# Patient Record
Sex: Male | Born: 1985 | Race: White | Hispanic: No | Marital: Single | Smoking: Former smoker
Health system: Southern US, Community
[De-identification: ages and names within clinical notes are randomized; demographics above are authoritative.]

## PROBLEM LIST (undated history)

## (undated) DIAGNOSIS — Z789 Other specified health status: Secondary | ICD-10-CM

## (undated) HISTORY — PX: WISDOM TOOTH EXTRACTION: SHX21

## (undated) HISTORY — PX: WRIST SURGERY: SHX841

---

## 2015-08-23 ENCOUNTER — Encounter: Payer: Self-pay | Admitting: Internal Medicine

## 2015-10-20 ENCOUNTER — Ambulatory Visit: Payer: Self-pay | Admitting: Internal Medicine

## 2015-11-15 MED FILL — ZUBSOLV 5.7-1.4 MG TAB SL: 5.7-1.4 | 2 days supply | Qty: 4 | Fill #0

## 2017-12-05 ENCOUNTER — Encounter (HOSPITAL_COMMUNITY)
Admission: EM | Disposition: A | Discharge status: Home / Self Care | Payer: Self-pay | Source: Home / Self Care | Attending: Orthopaedic Surgery

## 2017-12-05 ENCOUNTER — Emergency Department (HOSPITAL_COMMUNITY): Payer: Self-pay

## 2017-12-05 ENCOUNTER — Inpatient Hospital Stay (HOSPITAL_COMMUNITY)
Admission: EM | Admit: 2017-12-05 | Discharge: 2017-12-07 | DRG: 493 | Disposition: A | Discharge status: Home / Self Care | Payer: Self-pay | Attending: Orthopaedic Surgery | Admitting: Orthopaedic Surgery

## 2017-12-05 ENCOUNTER — Encounter (HOSPITAL_COMMUNITY): Payer: Self-pay | Admitting: Emergency Medicine

## 2017-12-05 ENCOUNTER — Emergency Department (HOSPITAL_COMMUNITY): Payer: Self-pay | Admitting: Certified Registered Nurse Anesthetist

## 2017-12-05 DIAGNOSIS — R339 Retention of urine, unspecified: Secondary | ICD-10-CM | POA: Diagnosis not present

## 2017-12-05 DIAGNOSIS — Y9351 Activity, roller skating (inline) and skateboarding: Secondary | ICD-10-CM

## 2017-12-05 DIAGNOSIS — W010XXA Fall on same level from slipping, tripping and stumbling without subsequent striking against object, initial encounter: Secondary | ICD-10-CM | POA: Diagnosis present

## 2017-12-05 DIAGNOSIS — T79A22A Traumatic compartment syndrome of left lower extremity, initial encounter: Secondary | ICD-10-CM | POA: Diagnosis present

## 2017-12-05 DIAGNOSIS — S82202A Unspecified fracture of shaft of left tibia, initial encounter for closed fracture: Principal | ICD-10-CM | POA: Diagnosis present

## 2017-12-05 DIAGNOSIS — Z23 Encounter for immunization: Secondary | ICD-10-CM

## 2017-12-05 DIAGNOSIS — Z419 Encounter for procedure for purposes other than remedying health state, unspecified: Secondary | ICD-10-CM

## 2017-12-05 DIAGNOSIS — S82402A Unspecified fracture of shaft of left fibula, initial encounter for closed fracture: Secondary | ICD-10-CM

## 2017-12-05 HISTORY — PX: TIBIA IM NAIL INSERTION: SHX2516

## 2017-12-05 HISTORY — DX: Other specified health status: Z78.9

## 2017-12-05 LAB — BASIC METABOLIC PANEL
ANION GAP: 11 (ref 5–15)
BUN: 14 mg/dL (ref 6–20)
CALCIUM: 9.7 mg/dL (ref 8.9–10.3)
CO2: 23 mmol/L (ref 22–32)
Chloride: 103 mmol/L (ref 101–111)
Creatinine, Ser: 0.9 mg/dL (ref 0.61–1.24)
Glucose, Bld: 95 mg/dL (ref 65–99)
Potassium: 3.8 mmol/L (ref 3.5–5.1)
SODIUM: 137 mmol/L (ref 135–145)

## 2017-12-05 LAB — CBC WITH DIFFERENTIAL/PLATELET
BASOS ABS: 0.1 10*3/uL (ref 0.0–0.1)
BASOS PCT: 0 %
Eosinophils Absolute: 0 10*3/uL (ref 0.0–0.7)
Eosinophils Relative: 0 %
HCT: 43.9 % (ref 39.0–52.0)
HEMOGLOBIN: 16.2 g/dL (ref 13.0–17.0)
Lymphocytes Relative: 12 %
Lymphs Abs: 1.4 10*3/uL (ref 0.7–4.0)
MCH: 31 pg (ref 26.0–34.0)
MCHC: 36.9 g/dL — ABNORMAL HIGH (ref 30.0–36.0)
MCV: 83.9 fL (ref 78.0–100.0)
MONO ABS: 0.8 10*3/uL (ref 0.1–1.0)
Monocytes Relative: 7 %
NEUTROS ABS: 9.7 10*3/uL — AB (ref 1.7–7.7)
NEUTROS PCT: 81 %
Platelets: 220 10*3/uL (ref 150–400)
RBC: 5.23 MIL/uL (ref 4.22–5.81)
RDW: 12.6 % (ref 11.5–15.5)
WBC: 12 10*3/uL — ABNORMAL HIGH (ref 4.0–10.5)

## 2017-12-05 SURGERY — INSERTION, INTRAMEDULLARY ROD, TIBIA
Anesthesia: General | Site: Leg Lower | Laterality: Left

## 2017-12-05 MED ORDER — METOCLOPRAMIDE HCL 5 MG/ML IJ SOLN: 5.0000 mg | Freq: Three times a day (TID) | INTRAMUSCULAR | Status: DC | PRN

## 2017-12-05 MED ORDER — ROCURONIUM BROMIDE 10 MG/ML (PF) SYRINGE
PREFILLED_SYRINGE | INTRAVENOUS | Status: DC | PRN
  Administered 2017-12-05: 40 mg via INTRAVENOUS
  Administered 2017-12-05 (×4): 10 mg via INTRAVENOUS

## 2017-12-05 MED ORDER — FENTANYL CITRATE (PF) 250 MCG/5ML IJ SOLN
INTRAMUSCULAR | Status: AC
  Filled 2017-12-05: qty 5

## 2017-12-05 MED ORDER — CEFAZOLIN SODIUM-DEXTROSE 1-4 GM/50ML-% IV SOLN
1.0000 g | Freq: Four times a day (QID) | INTRAVENOUS | Status: AC
  Administered 2017-12-06 (×3): 1 g via INTRAVENOUS
  Filled 2017-12-05 (×3): qty 50

## 2017-12-05 MED ORDER — METOCLOPRAMIDE HCL 5 MG PO TABS: 5.0000 mg | ORAL_TABLET | Freq: Three times a day (TID) | ORAL | Status: DC | PRN

## 2017-12-05 MED ORDER — ONDANSETRON HCL 4 MG/2ML IJ SOLN: 4.0000 mg | Freq: Once | INTRAMUSCULAR | Status: DC | PRN

## 2017-12-05 MED ORDER — ACETAMINOPHEN 325 MG PO TABS: 650.0000 mg | ORAL_TABLET | ORAL | Status: DC | PRN

## 2017-12-05 MED ORDER — METHOCARBAMOL 500 MG PO TABS
500.0000 mg | ORAL_TABLET | Freq: Four times a day (QID) | ORAL | Status: DC | PRN
  Administered 2017-12-06 – 2017-12-07 (×4): 500 mg via ORAL
  Filled 2017-12-05 (×4): qty 1

## 2017-12-05 MED ORDER — DEXTROSE 5 % IV SOLN
500.0000 mg | Freq: Four times a day (QID) | INTRAVENOUS | Status: DC | PRN
  Administered 2017-12-05: 500 mg via INTRAVENOUS
  Filled 2017-12-05: qty 550

## 2017-12-05 MED ORDER — ONDANSETRON HCL 4 MG PO TABS: 4.0000 mg | ORAL_TABLET | Freq: Four times a day (QID) | ORAL | Status: DC | PRN

## 2017-12-05 MED ORDER — MIDAZOLAM HCL 2 MG/2ML IJ SOLN
INTRAMUSCULAR | Status: AC
  Filled 2017-12-05: qty 2

## 2017-12-05 MED ORDER — HYDROCODONE-ACETAMINOPHEN 5-325 MG PO TABS
1.0000 | ORAL_TABLET | ORAL | Status: DC | PRN
  Administered 2017-12-06 – 2017-12-07 (×4): 2 via ORAL
  Filled 2017-12-05 (×4): qty 2

## 2017-12-05 MED ORDER — SUCCINYLCHOLINE CHLORIDE 200 MG/10ML IV SOSY
PREFILLED_SYRINGE | INTRAVENOUS | Status: DC | PRN
  Administered 2017-12-05: 120 mg via INTRAVENOUS

## 2017-12-05 MED ORDER — HYDROMORPHONE HCL 1 MG/ML IJ SOLN
1.0000 mg | Freq: Once | INTRAMUSCULAR | Status: AC
Start: 2017-12-05 — End: 2017-12-05
  Administered 2017-12-05: 1 mg via INTRAVENOUS
  Filled 2017-12-05: qty 1

## 2017-12-05 MED ORDER — LACTATED RINGERS IV SOLN
INTRAVENOUS | Status: DC
  Administered 2017-12-05 (×2): via INTRAVENOUS

## 2017-12-05 MED ORDER — SUGAMMADEX SODIUM 200 MG/2ML IV SOLN
INTRAVENOUS | Status: DC | PRN
  Administered 2017-12-05: 200 mg via INTRAVENOUS

## 2017-12-05 MED ORDER — LIDOCAINE 2% (20 MG/ML) 5 ML SYRINGE
INTRAMUSCULAR | Status: DC | PRN
  Administered 2017-12-05: 100 mg via INTRAVENOUS

## 2017-12-05 MED ORDER — MIDAZOLAM HCL 5 MG/5ML IJ SOLN
INTRAMUSCULAR | Status: DC | PRN
  Administered 2017-12-05: 2 mg via INTRAVENOUS

## 2017-12-05 MED ORDER — DEXMEDETOMIDINE HCL 200 MCG/2ML IV SOLN
INTRAVENOUS | Status: DC | PRN
  Administered 2017-12-05: 8 ug via INTRAVENOUS
  Administered 2017-12-05: 12 ug via INTRAVENOUS

## 2017-12-05 MED ORDER — 0.9 % SODIUM CHLORIDE (POUR BTL) OPTIME
TOPICAL | Status: DC | PRN
  Administered 2017-12-05: 1000 mL

## 2017-12-05 MED ORDER — PHENYLEPHRINE 40 MCG/ML (10ML) SYRINGE FOR IV PUSH (FOR BLOOD PRESSURE SUPPORT)
PREFILLED_SYRINGE | INTRAVENOUS | Status: DC | PRN
  Administered 2017-12-05 (×3): 80 ug via INTRAVENOUS

## 2017-12-05 MED ORDER — OXYCODONE HCL 5 MG PO TABS
10.0000 mg | ORAL_TABLET | ORAL | Status: DC | PRN
  Administered 2017-12-05 – 2017-12-06 (×4): 10 mg via ORAL
  Filled 2017-12-05 (×4): qty 2

## 2017-12-05 MED ORDER — CEFAZOLIN SODIUM-DEXTROSE 2-3 GM-%(50ML) IV SOLR
INTRAVENOUS | Status: DC | PRN
  Administered 2017-12-05: 2 g via INTRAVENOUS

## 2017-12-05 MED ORDER — SODIUM CHLORIDE 0.9 % IV SOLN
INTRAVENOUS | Status: DC
  Administered 2017-12-06 (×2): via INTRAVENOUS

## 2017-12-05 MED ORDER — FENTANYL CITRATE (PF) 100 MCG/2ML IJ SOLN
INTRAMUSCULAR | Status: DC | PRN
  Administered 2017-12-05: 50 ug via INTRAVENOUS
  Administered 2017-12-05: 200 ug via INTRAVENOUS
  Administered 2017-12-05 (×4): 50 ug via INTRAVENOUS

## 2017-12-05 MED ORDER — HYDROMORPHONE HCL 1 MG/ML IJ SOLN
INTRAMUSCULAR | Status: AC
  Filled 2017-12-05: qty 1

## 2017-12-05 MED ORDER — ONDANSETRON HCL 4 MG/2ML IJ SOLN: 4.0000 mg | Freq: Four times a day (QID) | INTRAMUSCULAR | Status: DC | PRN

## 2017-12-05 MED ORDER — DIPHENHYDRAMINE HCL 12.5 MG/5ML PO ELIX: 12.5000 mg | ORAL_SOLUTION | ORAL | Status: DC | PRN

## 2017-12-05 MED ORDER — ACETAMINOPHEN 650 MG RE SUPP: 650.0000 mg | RECTAL | Status: DC | PRN

## 2017-12-05 MED ORDER — DEXMEDETOMIDINE HCL IN NACL 200 MCG/50ML IV SOLN
INTRAVENOUS | Status: AC
  Filled 2017-12-05: qty 50

## 2017-12-05 MED ORDER — HYDROMORPHONE HCL 1 MG/ML IJ SOLN
1.0000 mg | INTRAMUSCULAR | Status: DC | PRN
  Administered 2017-12-06 (×2): 1 mg via INTRAVENOUS
  Filled 2017-12-05 (×2): qty 1

## 2017-12-05 MED ORDER — HYDROMORPHONE HCL 1 MG/ML IJ SOLN
0.2500 mg | INTRAMUSCULAR | Status: DC | PRN
  Administered 2017-12-05 (×4): 0.5 mg via INTRAVENOUS

## 2017-12-05 MED ORDER — ONDANSETRON HCL 4 MG/2ML IJ SOLN
INTRAMUSCULAR | Status: DC | PRN
  Administered 2017-12-05: 4 mg via INTRAVENOUS

## 2017-12-05 MED ORDER — MEPERIDINE HCL 50 MG/ML IJ SOLN: 6.2500 mg | INTRAMUSCULAR | Status: DC | PRN

## 2017-12-05 MED ORDER — CEFAZOLIN SODIUM-DEXTROSE 2-4 GM/100ML-% IV SOLN
INTRAVENOUS | Status: AC
  Filled 2017-12-05: qty 100

## 2017-12-05 MED ORDER — PROPOFOL 10 MG/ML IV BOLUS
INTRAVENOUS | Status: AC
  Filled 2017-12-05: qty 20

## 2017-12-05 MED ORDER — PROPOFOL 10 MG/ML IV BOLUS
INTRAVENOUS | Status: DC | PRN
  Administered 2017-12-05: 150 mg via INTRAVENOUS

## 2017-12-05 SURGICAL SUPPLY — 47 items
BANDAGE ACE 4X5 VEL STRL LF (GAUZE/BANDAGES/DRESSINGS) ×3 IMPLANT
BANDAGE ACE 6X5 VEL STRL LF (GAUZE/BANDAGES/DRESSINGS) ×3 IMPLANT
BIT DRILL AO GAMMA 4.2X130 (BIT) ×3 IMPLANT
BIT DRILL AO GAMMA 4.2X180 (BIT) ×3 IMPLANT
BIT DRILL AO GAMMA 4.2X340 (BIT) ×3 IMPLANT
CLOTH BEACON ORANGE TIMEOUT ST (SAFETY) ×3 IMPLANT
COVER SURGICAL LIGHT HANDLE (MISCELLANEOUS) ×3 IMPLANT
DRAPE C-ARM 42X120 X-RAY (DRAPES) ×3 IMPLANT
DRAPE C-ARMOR (DRAPES) ×3 IMPLANT
DRAPE U-SHAPE 47X51 STRL (DRAPES) ×3 IMPLANT
DURAPREP 26ML APPLICATOR (WOUND CARE) ×3 IMPLANT
ELECT REM PT RETURN 15FT ADLT (MISCELLANEOUS) ×3 IMPLANT
GAUZE SPONGE 4X4 12PLY STRL (GAUZE/BANDAGES/DRESSINGS) ×3 IMPLANT
GAUZE XEROFORM 5X9 LF (GAUZE/BANDAGES/DRESSINGS) ×3 IMPLANT
GLOVE BIO SURGEON STRL SZ7.5 (GLOVE) ×6 IMPLANT
GLOVE BIOGEL PI IND STRL 8 (GLOVE) ×2 IMPLANT
GLOVE BIOGEL PI INDICATOR 8 (GLOVE) ×4
GLOVE ECLIPSE 8.0 STRL XLNG CF (GLOVE) ×3 IMPLANT
GOWN STRL REUS W/TWL LRG LVL3 (GOWN DISPOSABLE) ×3 IMPLANT
GOWN STRL REUS W/TWL XL LVL3 (GOWN DISPOSABLE) ×6 IMPLANT
GUIDEROD T2 3X1000 (ROD) ×3 IMPLANT
GUIDEWIRE GAMMA (WIRE) ×6 IMPLANT
K-WIRE FIXATION 3X285 COATED (WIRE) ×6
KWIRE FIXATION 3X285 COATED (WIRE) ×2 IMPLANT
MANIFOLD NEPTUNE II (INSTRUMENTS) ×3 IMPLANT
NAIL ELAS INSERT SLV SPI 8-11 (MISCELLANEOUS) ×3 IMPLANT
NAIL IM TIBIAL T2 10X34.5 (Nail) ×1 IMPLANT
NAIL TIBIAL 10X34.5 (Nail) ×3 IMPLANT
PACK TOTAL KNEE CUSTOM (KITS) ×3 IMPLANT
PAD ABD 8X10 STRL (GAUZE/BANDAGES/DRESSINGS) ×9 IMPLANT
PAD CAST 4YDX4 CTTN HI CHSV (CAST SUPPLIES) ×1 IMPLANT
PADDING CAST COTTON 4X4 STRL (CAST SUPPLIES) ×2
PADDING CAST COTTON 6X4 STRL (CAST SUPPLIES) ×3 IMPLANT
POSITIONER SURGICAL ARM (MISCELLANEOUS) ×3 IMPLANT
REAMER INTRAMEDULLARY 8MM 510 (MISCELLANEOUS) ×3 IMPLANT
SCREW LOCKING FULL THREAD 5X52 (Screw) ×3 IMPLANT
SCREW LOCKING T2 F/T  5MMX40MM (Screw) ×2 IMPLANT
SCREW LOCKING T2 F/T  5MMX45MM (Screw) ×2 IMPLANT
SCREW LOCKING T2 F/T 5MMX40MM (Screw) ×1 IMPLANT
SCREW LOCKING T2 F/T 5MMX45MM (Screw) ×1 IMPLANT
SUT ETHILON 2 0 PSLX (SUTURE) ×6 IMPLANT
SUT VIC AB 0 CT1 27 (SUTURE) ×4
SUT VIC AB 0 CT1 27XBRD ANTBC (SUTURE) ×2 IMPLANT
SUT VIC AB 1 CT1 36 (SUTURE) ×3 IMPLANT
SUT VIC AB 2-0 CT1 27 (SUTURE) ×4
SUT VIC AB 2-0 CT1 TAPERPNT 27 (SUTURE) ×2 IMPLANT
WATER STERILE IRR 1000ML POUR (IV SOLUTION) ×3 IMPLANT

## 2017-12-05 NOTE — Brief Op Note (Signed)
12/05/2017  9:17 PM  PATIENT:  Carl Ramirez  32 y.o. male  PRE-OPERATIVE DIAGNOSIS:  left tibia/fibula fracture  POST-OPERATIVE DIAGNOSIS:  left tibia/fibula fracture  PROCEDURE:  Procedure(s): INTRAMEDULLARY (IM) NAIL TIBIAL, FOUR COMPARTMENT FASCIOTOMIES LEFT LEG (Left)  SURGEON:  Surgeon(s) and Role:    Kathryne Hitch* Ezequias Lard Y, MD - Primary  PHYSICIAN ASSISTANT: Rexene EdisonGil Clark, PA-C  ANESTHESIA:   general  EBL:  150 mL   COUNTS:  YES  DICTATION: .Other Dictation: Dictation Number [id number]  PLAN OF CARE: Admit to inpatient   PATIENT DISPOSITION:  PACU - hemodynamically stable.   Delay start of Pharmacological VTE agent (>24hrs) due to surgical blood loss or risk of bleeding: no

## 2017-12-05 NOTE — ED Provider Notes (Signed)
Medical screening examination/treatment/procedure(s) were conducted as a shared visit with non-physician practitioner(s) and myself.  I personally evaluated the patient during the encounter.   EKG Interpretation None       31yM with mid L tib/fib fxs. Fell while skateboarding. Closed injuries. Compartments soft. Can wiggle toes. Sensation intact to light touch. Palpable DP pulse. Toenails painted red. Scattered poorly done tattoos. Will need ORIF. Will discuss with ortho. Reduction/splinting deferred unless going to be discharged. Also possible rib fxs. Will be getting pain meds for leg anyways. He offered history of opiate abuse. Their usage is certainly appropriate with these injuries but will let him use his best judgement if he wants to use them acutely. Incentive spirometry.   Dg Ribs Unilateral W/chest Left  Result Date: 12/05/2017 CLINICAL DATA:  Recent fall with left-sided rib pain, initial encounter EXAM: LEFT RIBS AND CHEST - 3+ VIEW COMPARISON:  None. FINDINGS: Cardiac shadow is within normal limits. The lungs are well aerated bilaterally. No pneumothorax is seen. Mild irregularity is noted along the superior margin of the left sixth and seventh ribs posteriorly. It would be difficult to exclude an undisplaced rib fracture. No underlying complicating factors are seen. No other rib fractures are noted. IMPRESSION: Mild irregularity in the left sixth and seventh ribs posteriorly. These may represent undisplaced fractures. Electronically Signed   By: Alcide CleverMark  Lukens M.D.   On: 12/05/2017 17:03   Dg Tibia/fibula Left  Result Date: 12/05/2017 CLINICAL DATA:  Left lower leg pain after losing control of the skateboard going down heel. EXAM: LEFT TIBIA AND FIBULA - 2 VIEW COMPARISON:  None in PACs FINDINGS: The patient has sustained complete fractures of the mid shafts of the left tibia and fibula. There angulation at both fracture sites apex anterior. There is comminution of the fracture fragments.  There is displacement as well. IMPRESSION: There are acute comminuted angulated and displaced fractures of the midshaft of the left tibia and fibula. No definite soft tissue disruption is observed. The observed portions of the knee and ankle exhibit no acute abnormalities. Electronically Signed   By: David  SwazilandJordan M.D.   On: 12/05/2017 15:48     Raeford RazorKohut, Tabitha Tupper, MD 12/05/17 1712

## 2017-12-05 NOTE — ED Notes (Signed)
Bed: [facility] Expected date:  Expected time:  Means of arrival:  Comments: 

## 2017-12-05 NOTE — Anesthesia Preprocedure Evaluation (Signed)
Anesthesia Evaluation  Patient identified by MRN, date of birth, ID band Patient awake    Reviewed: Allergy & Precautions, NPO status , Patient's Chart, lab work & pertinent test results  Airway Mallampati: I  TM Distance: >3 FB Neck ROM: Full    Dental   Pulmonary    Pulmonary exam normal        Cardiovascular Normal cardiovascular exam     Neuro/Psych    GI/Hepatic   Endo/Other    Renal/GU      Musculoskeletal   Abdominal   Peds  Hematology   Anesthesia Other Findings   Reproductive/Obstetrics                             Anesthesia Physical Anesthesia Plan  ASA: II  Anesthesia Plan: General   Post-op Pain Management:    Induction: Intravenous, Rapid sequence and Cricoid pressure planned  PONV Risk Score and Plan: 2 and Ondansetron, Midazolam and Treatment may vary due to age or medical condition  Airway Management Planned: Oral ETT  Additional Equipment:   Intra-op Plan:   Post-operative Plan: Extubation in OR  Informed Consent: I have reviewed the patients History and Physical, chart, labs and discussed the procedure including the risks, benefits and alternatives for the proposed anesthesia with the patient or authorized representative who has indicated his/her understanding and acceptance.     Plan Discussed with: CRNA and Surgeon  Anesthesia Plan Comments:         Anesthesia Quick Evaluation

## 2017-12-05 NOTE — ED Provider Notes (Signed)
[facility] Provider Note   CSN: [id number] Arrival date & time: 12/05/17  1503     History   Chief Complaint Chief Complaint  Patient presents with  . Leg Pain    HPI Carl Ramirez is a 32 y.o. male with no significant past medical history, who presents to ED for evaluation of left leg pain after accident that occurred prior to arrival.  He was skateboarding down a hill at approximately 15 mph when he fell onto his left leg.  He does not remember which direction his leg twisted or turned.  He denies any head injury or loss of consciousness.  States that he noticed his leg "just dangling off" but no exposed bone.  He denies any loss of sensation.  Denies any previous fracture, dislocations or procedures in the area.  His only pertinent for though history includes right wrist fracture several years ago.  He denies any back pain, neck pain, trouble breathing, trouble swallowing, or vomiting.  HPI  History reviewed. No pertinent past medical history.  Patient Active Problem List   Diagnosis Date Noted  . Closed fracture of left fibula and tibia     Past Surgical History:  Procedure Laterality Date  . WISDOM TOOTH EXTRACTION    . WRIST SURGERY Right    35yrs ago       Home Medications    Prior to Admission medications   Not on File    Family History History reviewed. No pertinent family history.  Social History Social History   Tobacco Use  . Smoking status: Former Smoker    Types: Cigarettes    Last attempt to quit: 12/05/2012    Years since quitting: 5.0  Substance Use Topics  . Alcohol use: No    Frequency: Never  . Drug use: No     Allergies   Patient has no known allergies.   Review of Systems Review of Systems  Constitutional: Negative for appetite change, chills and fever.  HENT: Negative for ear pain, rhinorrhea, sneezing and sore throat.   Eyes: Negative for photophobia and visual disturbance.  Respiratory: Negative for  cough, chest tightness, shortness of breath and wheezing.   Cardiovascular: Negative for chest pain and palpitations.  Gastrointestinal: Negative for abdominal pain, blood in stool, constipation, diarrhea, nausea and vomiting.  Genitourinary: Negative for dysuria, hematuria and urgency.  Musculoskeletal: Positive for myalgias.  Skin: Negative for rash.  Neurological: Negative for dizziness, weakness and light-headedness.     Physical Exam Updated Vital Signs BP 107/71   Pulse (!) 108   Temp 98.3 F (36.8 C)   Resp 20   SpO2 99%   Physical Exam  Constitutional: He appears well-developed and well-nourished. No distress.  HENT:  Head: Normocephalic and atraumatic.  Nose: Nose normal.  Eyes: Conjunctivae and EOM are normal. Right eye exhibits no discharge. Left eye exhibits no discharge. No scleral icterus.  Neck: Normal range of motion. Neck supple.  Cardiovascular: Normal rate, regular rhythm, normal heart sounds and intact distal pulses. Exam reveals no gallop and no friction rub.  No murmur heard. Pulmonary/Chest: Effort normal and breath sounds normal. No respiratory distress.      Abdominal: Soft. Bowel sounds are normal. He exhibits no distension. There is no tenderness. There is no guarding.  Musculoskeletal: He exhibits edema, tenderness and deformity.  There is obvious edema of the area of the left tibia and fibula indicated in the image.  2+ DP pulse noted.  Able to perform full  active and passive range of motion of ankle and digits.  Sensation intact to light touch of left lower extremity.  No open wounds or fractures noted.  Neurological: He is alert. He exhibits normal muscle tone. Coordination normal.  Skin: Skin is warm and dry. No rash noted.  Psychiatric: He has a normal mood and affect.  Nursing note and vitals reviewed.      ED Treatments / Results  Labs (all labs ordered are listed, but only abnormal results are displayed) Labs Reviewed  CBC WITH  DIFFERENTIAL/PLATELET - Abnormal; Notable for the following components:      Result Value   WBC 12.0 (*)    MCHC 36.9 (*)    Neutro Abs 9.7 (*)    All other components within normal limits  BASIC METABOLIC PANEL    EKG  EKG Interpretation None       Radiology Dg Ribs Unilateral W/chest Left  Result Date: 12/05/2017 CLINICAL DATA:  Recent fall with left-sided rib pain, initial encounter EXAM: LEFT RIBS AND CHEST - 3+ VIEW COMPARISON:  None. FINDINGS: Cardiac shadow is within normal limits. The lungs are well aerated bilaterally. No pneumothorax is seen. Mild irregularity is noted along the superior margin of the left sixth and seventh ribs posteriorly. It would be difficult to exclude an undisplaced rib fracture. No underlying complicating factors are seen. No other rib fractures are noted. IMPRESSION: Mild irregularity in the left sixth and seventh ribs posteriorly. These may represent undisplaced fractures. Electronically Signed   By: Alcide CleverMark  Lukens M.D.   On: 12/05/2017 17:03   Dg Tibia/fibula Left  Result Date: 12/05/2017 CLINICAL DATA:  Left lower leg pain after losing control of the skateboard going down heel. EXAM: LEFT TIBIA AND FIBULA - 2 VIEW COMPARISON:  None in PACs FINDINGS: The patient has sustained complete fractures of the mid shafts of the left tibia and fibula. There angulation at both fracture sites apex anterior. There is comminution of the fracture fragments. There is displacement as well. IMPRESSION: There are acute comminuted angulated and displaced fractures of the midshaft of the left tibia and fibula. No definite soft tissue disruption is observed. The observed portions of the knee and ankle exhibit no acute abnormalities. Electronically Signed   By: David  [place] M.D.   On: 12/05/2017 15:48    Procedures Procedures (including critical care time)  Medications Ordered in ED Medications  lactated ringers infusion ( Intravenous New Bag/Given 12/05/17 1906)    ceFAZolin (ANCEF) 2-4 GM/100ML-% IVPB (not administered)  HYDROmorphone (DILAUDID) injection 1 mg (1 mg Intravenous Given 12/05/17 1558)     Initial Impression / Assessment and Plan / ED Course  I have reviewed the triage vital signs and the nursing notes.  Pertinent labs & imaging results that were available during my care of the patient were reviewed by me and considered in my medical decision making (see chart for details).     Patient presents to ED for evaluation of left mid tib-fib fractures that occurred just prior to arrival.  He fell while he was skateboarding.  On physical exam his compartments are soft and he has full active and passive range of motion of ankle and digits. Area is CMS intact.  2+ DP pulse noted.  He has no loss of sensation.  He denies any head injuries, neck pain, back pain.  He does report left-sided posterior rib pain.  X-ray confirmed comminuted, displaced mid left tib-fib fractures.  Rib x-rays also show possible irregularity of left sixth  and seventh ribs posteriorly with indication for possible nondisplaced fractures.  Will give patient incentive spirometry and pain medications per Driscilla Grammes  Consulted orthopedics who will take patient for further evaluation.  Appreciate help of orthopedist for further evaluation.  Final Clinical Impressions(s) / ED Diagnoses   Final diagnoses:  None    ED Discharge Orders    None     Portions of this note were generated with Dragon dictation software. Dictation errors may occur despite best attempts at proofreading.    Dietrich Pates, PA-C 12/05/17 1921    Raeford Razor, MD 12/06/17 [id number]

## 2017-12-05 NOTE — ED Notes (Signed)
Bed: [facility]02 Expected date:  Expected time:  Means of arrival:  Comments: Hold hall A

## 2017-12-05 NOTE — ED Triage Notes (Signed)
Per EMS-states he was skateboarding down a hill around 15 mph-board started wobbling and he fell on left leg-positive pedal pulses, obvious deformity-board splinting applied-did not hit head, no LOC-100 mcg of Fentanyl intranasally given in route

## 2017-12-05 NOTE — Anesthesia Procedure Notes (Signed)
Procedure Name: Intubation Performed by: Shakara Tweedy J, CRNA Pre-anesthesia Checklist: Patient identified, Emergency Drugs available, Suction available, Patient being monitored and Timeout performed Patient Re-evaluated:Patient Re-evaluated prior to induction Oxygen Delivery Method: Circle system utilized Preoxygenation: Pre-oxygenation with 100% oxygen Induction Type: IV induction and Rapid sequence Laryngoscope Size: Mac and 4 Grade View: Grade I Tube type: Oral Tube size: 7.5 mm Number of attempts: 1 Airway Equipment and Method: Stylet Placement Confirmation: ETT inserted through vocal cords under direct vision,  positive ETCO2,  CO2 detector and breath sounds checked- equal and bilateral Secured at: 23 cm Tube secured with: Tape Dental Injury: Teeth and Oropharynx as per pre-operative assessment        

## 2017-12-05 NOTE — Consult Note (Signed)
Reason for Consult: Left tibia/fibula fracture Referring Physician:  Dr. Maryanna Shape Groleau is an 32 y.o. male.  HPI: The patient is a 32 year old gentleman who was riding a skateboard several hours ago and sustained significant mechanical fall off of a skateboard.  He suffered an injury to his left tibia and was brought to the [facility] emergency room.  X-rays confirmed a midshaft tibia and fibula fracture and orthopedic surgery was consulted.  He denies any other injuries.  When I first examining him in the emergency room his compartment on the left leg are full.  He denies any severe pain any numbness and tingling in his left foot.  However both times got him to the holding room his pulse is diminishing and his pain is worsening.  He also has slightly decreased sensation over his left foot as well.  History reviewed. No pertinent past medical history.  History reviewed. No pertinent surgical history.  No family history on file.  Social History:  has no tobacco, alcohol, and drug history on file.  Allergies: No Known Allergies  Medications: I have reviewed the patient's current medications.  Results for orders placed or performed during the hospital encounter of 12/05/17 (from the past 48 hour(s))  Basic metabolic panel     Status: None   Collection Time: 12/05/17  3:55 PM  Result Value Ref Range   Sodium 137 135 - 145 mmol/L   Potassium 3.8 3.5 - 5.1 mmol/L   Chloride 103 101 - 111 mmol/L   CO2 23 22 - 32 mmol/L   Glucose, Bld 95 65 - 99 mg/dL   BUN 14 6 - 20 mg/dL   Creatinine, Ser 0.90 0.61 - 1.24 mg/dL   Calcium 9.7 8.9 - 10.3 mg/dL   GFR calc non Af Amer >60 >60 mL/min   GFR calc Af Amer >60 >60 mL/min    Comment: (NOTE) The eGFR has been calculated using the CKD EPI equation. This calculation has not been validated in all clinical situations. eGFR's persistently <60 mL/min signify possible Chronic Kidney Disease.    Anion gap 11 5 - 15  CBC with Differential      Status: Abnormal   Collection Time: 12/05/17  3:55 PM  Result Value Ref Range   WBC 12.0 (H) 4.0 - 10.5 K/uL   RBC 5.23 4.22 - 5.81 MIL/uL   Hemoglobin 16.2 13.0 - 17.0 g/dL   HCT 43.9 39.0 - 52.0 %   MCV 83.9 78.0 - 100.0 fL   MCH 31.0 26.0 - 34.0 pg   MCHC 36.9 (H) 30.0 - 36.0 g/dL   RDW 12.6 11.5 - 15.5 %   Platelets 220 150 - 400 K/uL   Neutrophils Relative % 81 %   Neutro Abs 9.7 (H) 1.7 - 7.7 K/uL   Lymphocytes Relative 12 %   Lymphs Abs 1.4 0.7 - 4.0 K/uL   Monocytes Relative 7 %   Monocytes Absolute 0.8 0.1 - 1.0 K/uL   Eosinophils Relative 0 %   Eosinophils Absolute 0.0 0.0 - 0.7 K/uL   Basophils Relative 0 %   Basophils Absolute 0.1 0.0 - 0.1 K/uL    Dg Ribs Unilateral W/chest Left  Result Date: 12/05/2017 CLINICAL DATA:  Recent fall with left-sided rib pain, initial encounter EXAM: LEFT RIBS AND CHEST - 3+ VIEW COMPARISON:  None. FINDINGS: Cardiac shadow is within normal limits. The lungs are well aerated bilaterally. No pneumothorax is seen. Mild irregularity is noted along the superior margin of the  left sixth and seventh ribs posteriorly. It would be difficult to exclude an undisplaced rib fracture. No underlying complicating factors are seen. No other rib fractures are noted. IMPRESSION: Mild irregularity in the left sixth and seventh ribs posteriorly. These may represent undisplaced fractures. Electronically Signed   By: Inez Catalina M.D.   On: 12/05/2017 17:03   Dg Tibia/fibula Left  Result Date: 12/05/2017 CLINICAL DATA:  Left lower leg pain after losing control of the skateboard going down heel. EXAM: LEFT TIBIA AND FIBULA - 2 VIEW COMPARISON:  None in PACs FINDINGS: The patient has sustained complete fractures of the mid shafts of the left tibia and fibula. There angulation at both fracture sites apex anterior. There is comminution of the fracture fragments. There is displacement as well. IMPRESSION: There are acute comminuted angulated and displaced fractures of the  midshaft of the left tibia and fibula. No definite soft tissue disruption is observed. The observed portions of the knee and ankle exhibit no acute abnormalities. Electronically Signed   By: David  Martinique M.D.   On: 12/05/2017 15:48    Review of Systems  All other systems reviewed and are negative.  Blood pressure 115/75, pulse 98, temperature 97.8 F (36.6 C), temperature source Oral, resp. rate 18, SpO2 99 %. Physical Exam  Constitutional: He is oriented to person, place, and time. He appears well-developed and well-nourished.  HENT:  Head: Normocephalic and atraumatic.  Eyes: EOM are normal. Pupils are equal, round, and reactive to light.  Neck: Normal range of motion. Neck supple.  Cardiovascular: Normal rate and regular rhythm.  Respiratory: Effort normal and breath sounds normal.  GI: Bowel sounds are normal.  Musculoskeletal:       Left lower leg: He exhibits tenderness, bony tenderness, swelling and deformity.  Neurological: He is alert and oriented to person, place, and time.  Skin: Skin is warm and dry.  Psychiatric: He has a normal mood and affect.   His foot on the left side has decreased pulses an hour after I first saw him.  He is now about 4 hours into this injury and his compartments of the left leg are feeling significantly more full and swollen.  Assessment/Plan: Left closed midshaft tib-fib fracture with possible impending compartment syndrome  I talked to him in length and he understands the need to proceed to surgery this evening for intramedullary nail to stabilize his left tibia.  Given the worsening swelling in his leg from even when I first saw him until now I will likely need to perform fasciotomies to release pressure.  I talked to him about this in length in detail as well and he understands.  The risk and benefits of surgery explained in detail and informed consent is obtained.  Mcarthur Rossetti 12/05/2017, 6:48 PM

## 2017-12-05 NOTE — Transfer of Care (Signed)
Immediate Anesthesia Transfer of Care Note  Patient: Carl Ramirez  Procedure(s) Performed: INTRAMEDULLARY (IM) NAIL TIBIAL, FOUR COMPARTMENT FASCIOTOMIES LEFT LEG (Left Leg Lower)  Patient Location: PACU  Anesthesia Type:General  Level of Consciousness: awake, alert  and oriented  Airway & Oxygen Therapy: Patient Spontanous Breathing and Patient connected to face mask oxygen  Post-op Assessment: Report given to RN and Post -op Vital signs reviewed and stable  Post vital signs: Reviewed and stable  Last Vitals:  Vitals:   12/05/17 1726 12/05/17 1854  BP: 115/75 107/71  Pulse: 98 (!) 108  Resp: 18 20  Temp:  36.8 C  SpO2: 99% 99%    Last Pain:  Vitals:   12/05/17 1854  TempSrc:   PainSc: 10-Worst pain ever      Patients Stated Pain Goal: 4 (12/05/17 1854)  Complications: No apparent anesthesia complications

## 2017-12-06 ENCOUNTER — Encounter (HOSPITAL_COMMUNITY): Payer: Self-pay | Admitting: Orthopaedic Surgery

## 2017-12-06 ENCOUNTER — Other Ambulatory Visit: Payer: Self-pay

## 2017-12-06 MED ORDER — INFLUENZA VAC SPLIT QUAD 0.5 ML IM SUSY
0.5000 mL | PREFILLED_SYRINGE | INTRAMUSCULAR | Status: AC
  Administered 2017-12-07: 0.5 mL via INTRAMUSCULAR
  Filled 2017-12-06: qty 0.5

## 2017-12-06 MED ORDER — TAMSULOSIN HCL 0.4 MG PO CAPS
0.4000 mg | ORAL_CAPSULE | Freq: Every day | ORAL | Status: AC
  Administered 2017-12-06: 0.4 mg via ORAL
  Filled 2017-12-06: qty 1

## 2017-12-06 NOTE — Progress Notes (Signed)
Physical Therapy Treatment Patient Details Name: Carl Ramirez MRN: [id number] DOB: 06/21/1986 Today's Date: 12/06/2017    History of Present Illness 32 yo male s/p IM nail L tibia, closed treatment L fibula, four compartment fasciotomy 12/05/17.     PT Comments    Progressing with mobility. Min guard-Min assist for mobility. He was able to ambulate and negotiate stairs with crutches. Pain rated 7/10 during session. Encouraged pt to at least try to maintain TDWB for balance during ambulation and stair negotiation. Currently he is mostly using NWB pattern. Encouraged pt to wiggle toes, perform ankle pumps as able. Also encouraged him to try to begin leg lift/SLR with assistance from caregiver. Will continue to follow during hospital stay.     Follow Up Recommendations  No PT follow up(will likely need to consider OP PT after follow up with surgeon. )     Equipment Recommendations  Crutches(given already)    Recommendations for Other Services       Precautions / Restrictions Precautions Precautions: Fall Restrictions Weight Bearing Restrictions: Yes LLE Weight Bearing: Weight bearing as tolerated    Mobility  Bed Mobility Overal bed mobility: Needs Assistance Bed Mobility: Supine to Sit;Sit to Supine     Supine to sit: Min assist;HOB elevated Sit to supine: Min assist;HOB elevated   General bed mobility comments: Assist for L LE on/off bed. Increased time.   Transfers Overall transfer level: Needs assistance Equipment used: Rolling walker (2 wheeled);Crutches Transfers: Sit to/from Stand Sit to Stand: Min assist         General transfer comment: small amount of assist to rise, steady. VCs safety, technique, hand/LE placement  Ambulation/Gait Ambulation/Gait assistance: Min guard Ambulation Distance (Feet): 75 Feet Assistive device: Crutches Gait Pattern/deviations: Step-to pattern     General Gait Details: close guard for safety. VCs safety, sequence, technique,  pacing.    Stairs Stairs: Yes Min Assist Stair Management: Step to pattern Number of Stairs: 2 General stair comments: up and over portable steps with 2 crutches. VCS safety, technique, sequence. Girlfriend present to observe.   Wheelchair Mobility    Modified Rankin (Stroke Patients Only)       Balance Overall balance assessment: Needs assistance         Standing balance support: Bilateral upper extremity supported Standing balance-Leahy Scale: Poor                              Cognition Arousal/Alertness: Awake/alert Behavior During Therapy: WFL for tasks assessed/performed Overall Cognitive Status: Within Functional Limits for tasks assessed                                        Exercises      General Comments        Pertinent Vitals/Pain Pain Assessment: 0-10 Pain Score: 7  Pain Location: L LE Pain Descriptors / Indicators: Aching;Sore;Grimacing;Discomfort Pain Intervention(s): Monitored during session;Repositioned    Home Living                      Prior Function            PT Goals (current goals can now be found in the care plan section) Progress towards PT goals: Progressing toward goals    Frequency    Min 4X/week      PT Plan Current plan remains appropriate  Co-evaluation              AM-PAC PT "6 Clicks" Daily Activity  Outcome Measure  Difficulty turning over in bed (including adjusting bedclothes, sheets and blankets)?: Unable Difficulty moving from lying on back to sitting on the side of the bed? : Unable Difficulty sitting down on and standing up from a chair with arms (e.g., wheelchair, bedside commode, etc,.)?: Unable Help needed moving to and from a bed to chair (including a wheelchair)?: A Little Help needed walking in hospital room?: A Little Help needed climbing 3-5 steps with a railing? : A Lot 6 Click Score: 11    End of Session Equipment Utilized During Treatment:  Gait belt Activity Tolerance: Patient limited by pain Patient left: in bed;with call bell/phone within reach;with family/visitor present   PT Visit Diagnosis: Muscle weakness (generalized) (M62.81);Difficulty in walking, not elsewhere classified (R26.2);Pain Pain - Right/Left: Left Pain - part of body: Leg     Time: [id number] PT Time Calculation (min) (ACUTE ONLY): 39 min  Charges:  $Gait Training: 23-37 mins $Therapeutic Activity: 8-22 mins                    G Codes:         Rebeca AlertJannie Brenleigh Collet, MPT Pager: [phone]

## 2017-12-06 NOTE — Progress Notes (Signed)
Patient was able to void unmeasurable amount in bathroom. Bladder scan repeated which shows 864 mL.  In and out cath competed per MD order with 725 mL urine return.

## 2017-12-06 NOTE — Progress Notes (Signed)
Subjective: 1 Day Post-Op Procedure(s) (LRB): INTRAMEDULLARY (IM) NAIL TIBIAL, FOUR COMPARTMENT FASCIOTOMIES LEFT LEG (Left) Patient reports pain as moderate.  Has had some urinary retention.  Objective: Vital signs in last 24 hours: Temp:  [98.1 F (36.7 C)-99.3 F (37.4 C)] 98.4 F (36.9 C) (01/25 1400) Pulse Rate:  [82-111] 108 (01/25 1400) Resp:  [16-27] 16 (01/25 1400) BP: (107-139)/(69-88) 124/71 (01/25 1400) SpO2:  [96 %-100 %] 96 % (01/25 1400) Weight:  [196 lb (88.9 kg)] 196 lb (88.9 kg) (01/25 0621)  Intake/Output from previous day: 01/24 0701 - 01/25 0700 In: 2220 [P.O.:720; I.V.:1500] Out: 150 [Blood:150] Intake/Output this shift: Total I/O In: 520 [P.O.:420; IV Piggyback:100] Out: 1175 [Urine:1175]  Recent Labs    12/05/17 1555  HGB 16.2   Recent Labs    12/05/17 1555  WBC 12.0*  RBC 5.23  HCT 43.9  PLT 220   Recent Labs    12/05/17 1555  NA 137  K 3.8  CL 103  CO2 23  BUN 14  CREATININE 0.90  GLUCOSE 95  CALCIUM 9.7   No results for input(s): LABPT, INR in the last 72 hours.  Sensation intact distally Intact pulses distally Incision: moderate drainage Compartment soft  Assessment/Plan: 1 Day Post-Op Procedure(s) (LRB): INTRAMEDULLARY (IM) NAIL TIBIAL, FOUR COMPARTMENT FASCIOTOMIES LEFT LEG (Left) Up with therapy Plan for discharge tomorrow  Kathryne HitchChristopher Y Johari Pinney 12/06/2017, 5:03 PM
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
## 2017-12-06 NOTE — Anesthesia Postprocedure Evaluation (Signed)
Anesthesia Post Note  Patient: Scot JunEric Ruland  Procedure(s) Performed: INTRAMEDULLARY (IM) NAIL TIBIAL, FOUR COMPARTMENT FASCIOTOMIES LEFT LEG (Left Leg Lower)     Patient location during evaluation: PACU Anesthesia Type: General Level of consciousness: awake and alert Pain management: pain level controlled Vital Signs Assessment: post-procedure vital signs reviewed and stable Respiratory status: spontaneous breathing, nonlabored ventilation, respiratory function stable and patient connected to nasal cannula oxygen Cardiovascular status: blood pressure returned to baseline and stable Postop Assessment: no apparent nausea or vomiting Anesthetic complications: no    Last Vitals:  Vitals:   12/05/17 2235 12/06/17 0531  BP: 139/85 111/69  Pulse: (!) 111 93  Resp:  16  Temp: 36.7 C 36.7 C  SpO2: 100% 100%    Last Pain:  Vitals:   12/06/17 0531  TempSrc: Oral  PainSc:                  Ellajane Stong DAVID

## 2017-12-06 NOTE — Evaluation (Signed)
Physical Therapy Evaluation Patient Details Name: Carl Ramirez MRN: [id number] DOB: 06/26/1986 Today's Date: 12/06/2017   History of Present Illness  32 yo male s/p IM nail L tibia, closed treatment L fibula, four compartment fasciotomy 12/05/17.   Clinical Impression  On eval, pt required Min assist for mobility. He was able to stand and take a few steps forwards then backwards with a RW. Pain rated 7/10. Pt was unable to progress ambulation distance due to significant discomfort from need to urinate. He politely requested to defer any further activity at this time. Will plan to return later to continue gait and stair training. Continuing to assess DME needs.     Follow Up Recommendations No PT follow up    Equipment Recommendations  Rolling walker with 5" wheels(continuing to assess). Possibly crutches, bsc???    Recommendations for Other Services       Precautions / Restrictions Precautions Precautions: Fall Restrictions Weight Bearing Restrictions: Yes LLE Weight Bearing: Weight bearing as tolerated      Mobility  Bed Mobility Overal bed mobility: Needs Assistance Bed Mobility: Supine to Sit;Sit to Supine     Supine to sit: Min assist;HOB elevated Sit to supine: HOB elevated   General bed mobility comments: Assist for L LE on/off bed. Increased time.   Transfers Overall transfer level: Needs assistance Equipment used: Rolling walker (2 wheeled) Transfers: Sit to/from Stand Sit to Stand: Min assist         General transfer comment: small amount of assist to rise, steady. VCs safety, technique, hand/LE placement  Ambulation/Gait Ambulation/Gait assistance: Min assist Ambulation Distance (Feet): 3 Feet Assistive device: Rolling walker (2 wheeled) Gait Pattern/deviations: Step-to pattern;Antalgic;Decreased stance time - left;Decreased dorsiflexion - left     General Gait Details: Pt took a few steps before requesting to stop. He reports significant discomfort  from need to urinate. He politely requested to defer any further ambulation at this time.   Stairs            Wheelchair Mobility    Modified Rankin (Stroke Patients Only)       Balance Overall balance assessment: Needs assistance         Standing balance support: Bilateral upper extremity supported Standing balance-Leahy Scale: Poor                               Pertinent Vitals/Pain Pain Assessment: 0-10 Pain Location: L LE Pain Descriptors / Indicators: Aching;Sore;Grimacing;Discomfort Pain Intervention(s): Limited activity within patient's tolerance;Repositioned    Home Living Family/patient expects to be discharged to:: Private residence Living Arrangements: Spouse/significant other Available Help at Discharge: (girlfriend) Type of Home: House Home Access: Stairs to enter Entrance Stairs-Rails: None Secretary/administrator of Steps: 3 Home Layout: One level Home Equipment: None      Prior Function Level of Independence: Independent               Hand Dominance        Extremity/Trunk Assessment   Upper Extremity Assessment Upper Extremity Assessment: Overall WFL for tasks assessed    Lower Extremity Assessment Lower Extremity Assessment: LLE deficits/detail LLE Deficits / Details: post op weakness. ace wrap thigh to foot. hip flex 2/5, hip abd/add 2/5. can wiggle toes.     Cervical / Trunk Assessment Cervical / Trunk Assessment: Normal  Communication   Communication: No difficulties  Cognition Arousal/Alertness: Awake/alert Behavior During Therapy: WFL for tasks assessed/performed Overall Cognitive Status: Within Functional Limits  for tasks assessed                                        General Comments      Exercises     Assessment/Plan    PT Assessment Patient needs continued PT services  PT Problem List Decreased strength;Decreased balance;Decreased range of motion;Decreased mobility;Decreased  activity tolerance;Pain       PT Treatment Interventions DME instruction;Gait training;Functional mobility training;Therapeutic activities;Balance training;Patient/family education;Therapeutic exercise    PT Goals (Current goals can be found in the Care Plan section)  Acute Rehab PT Goals Patient Stated Goal: home soon. less pain. regain PLOF.  PT Goal Formulation: With patient Time For Goal Achievement: 12/20/17 Potential to Achieve Goals: Good    Frequency Min 4X/week   Barriers to discharge        Co-evaluation               AM-PAC PT "6 Clicks" Daily Activity  Outcome Measure Difficulty turning over in bed (including adjusting bedclothes, sheets and blankets)?: Unable Difficulty moving from lying on back to sitting on the side of the bed? : Unable Difficulty sitting down on and standing up from a chair with arms (e.g., wheelchair, bedside commode, etc,.)?: Unable Help needed moving to and from a bed to chair (including a wheelchair)?: A Little Help needed walking in hospital room?: A Lot Help needed climbing 3-5 steps with a railing? : A Lot 6 Click Score: 10    End of Session Equipment Utilized During Treatment: Gait belt Activity Tolerance: Patient limited by pain Patient left: in bed;with call bell/phone within reach;with family/visitor present   PT Visit Diagnosis: Muscle weakness (generalized) (M62.81);Difficulty in walking, not elsewhere classified (R26.2);Pain Pain - Right/Left: Left Pain - part of body: Leg    Time: [id number] PT Time Calculation (min) (ACUTE ONLY): 17 min   Charges:   PT Evaluation $PT Eval Moderate Complexity: 1 Mod     PT G Codes:          Rebeca AlertJannie Mikiah Demond, MPT Pager: [phone]

## 2017-12-06 NOTE — Progress Notes (Signed)
Patient unable to void.  Bladder scan shows >1000.  Dr. Magnus IvanBlackman notified.  Will do in and out cath per MD order.

## 2017-12-07 MED ORDER — METHOCARBAMOL 500 MG PO TABS: 500.0000 mg | ORAL_TABLET | Freq: Four times a day (QID) | ORAL | 0 refills | Status: AC | PRN

## 2017-12-07 MED ORDER — ASPIRIN 81 MG PO CHEW: 81.0000 mg | CHEWABLE_TABLET | Freq: Two times a day (BID) | ORAL | 0 refills | Status: AC

## 2017-12-07 MED ORDER — OXYCODONE HCL 10 MG PO TABS: 5.0000 mg | ORAL_TABLET | ORAL | 0 refills | Status: DC | PRN

## 2017-12-07 NOTE — Progress Notes (Signed)
Patient ID: Carl Ramirez, male   DOB: 10/09/1986, 32 y.o.   MRN: [id number] Making slow improvements.  Over all, can be discharged to home today. Dressings on left leg changed at the bedside.

## 2017-12-07 NOTE — Discharge Instructions (Signed)
Expect swelling and bloody drainage. Ice and elevation as needed. Do try to pump your left foot (bend your ankle back and forth) several times daily. Keep your dressings clean and dry. You can leave these current dressings on for the next 3-4 days and then change them as needed. You can attempt to put weight as you tolerate on your left leg.

## 2017-12-07 NOTE — Discharge Summary (Signed)
Patient ID: Carl Ramirez MRN: [id number] DOB/AGE: 32/05/1986 31 y.o.  Admit date: 12/05/2017 Discharge date: 12/07/2017  Admission Diagnoses:  Active Problems:   Closed fracture of left fibula and tibia   Closed fracture of left tibia and fibula   Discharge Diagnoses:  Same  Past Medical History:  Diagnosis Date  . Medical history non-contributory     Surgeries: Procedure(s): INTRAMEDULLARY (IM) NAIL TIBIAL, FOUR COMPARTMENT FASCIOTOMIES LEFT LEG on 12/05/2017   Consultants: Treatment Team:  Kathryne HitchBlackman, Garrus Gauthreaux Y, MD  Discharged Condition: Improved  Hospital Course: Carl Junric Lowder is an 32 y.o. male who was admitted 12/05/2017 for operative treatment of<principal problem not specified>. Patient has severe unremitting pain that affects sleep, daily activities, and work/hobbies. After pre-op clearance the patient was taken to the operating room on 12/05/2017 and underwent  Procedure(s): INTRAMEDULLARY (IM) NAIL TIBIAL, FOUR COMPARTMENT FASCIOTOMIES LEFT LEG.    Patient was given perioperative antibiotics:  Anti-infectives (From admission, onward)   Start     Dose/Rate Route Frequency Ordered Stop   12/06/17 0200  ceFAZolin (ANCEF) IVPB 1 g/50 mL premix     1 g 100 mL/hr over 30 Minutes Intravenous Every 6 hours 12/05/17 2242 12/06/17 1605   12/05/17 1900  ceFAZolin (ANCEF) 2-4 GM/100ML-% IVPB    Comments:  Mirian Moarver, Kelley   : cabinet override      12/05/17 1900 12/06/17 0714       Patient was given sequential compression devices, early ambulation, and chemoprophylaxis to prevent DVT.  Patient benefited maximally from hospital stay and there were no complications.    Recent vital signs:  Patient Vitals for the past 24 hrs:  BP Temp Temp src Pulse Resp SpO2  12/07/17 0457 113/60 99.2 F (37.3 C) Oral 97 18 97 %  12/06/17 2128 123/63 98.6 F (37 C) Oral (!) 104 16 98 %  12/06/17 1400 124/71 98.4 F (36.9 C) Oral (!) 108 16 96 %     Recent laboratory studies:  Recent  Labs    12/05/17 1555  WBC 12.0*  HGB 16.2  HCT 43.9  PLT 220  NA 137  K 3.8  CL 103  CO2 23  BUN 14  CREATININE 0.90  GLUCOSE 95  CALCIUM 9.7     Discharge Medications:   Allergies as of 12/07/2017   No Known Allergies     Medication List    TAKE these medications   aspirin 81 MG chewable tablet Commonly known as:  ASPIRIN CHILDRENS Chew 1 tablet (81 mg total) by mouth 2 (two) times daily after a meal.   methocarbamol 500 MG tablet Commonly known as:  ROBAXIN Take 1 tablet (500 mg total) by mouth every 6 (six) hours as needed for muscle spasms.   Oxycodone HCl 10 MG Tabs Take 0.5-1 tablets (5-10 mg total) by mouth every 4 (four) hours as needed for severe pain ((score 7 to 10)).       Diagnostic Studies: Dg Ribs Unilateral W/chest Left  Result Date: 12/05/2017 CLINICAL DATA:  Recent fall with left-sided rib pain, initial encounter EXAM: LEFT RIBS AND CHEST - 3+ VIEW COMPARISON:  None. FINDINGS: Cardiac shadow is within normal limits. The lungs are well aerated bilaterally. No pneumothorax is seen. Mild irregularity is noted along the superior margin of the left sixth and seventh ribs posteriorly. It would be difficult to exclude an undisplaced rib fracture. No underlying complicating factors are seen. No other rib fractures are noted. IMPRESSION: Mild irregularity in the left sixth and seventh ribs posteriorly.  These may represent undisplaced fractures. Electronically Signed   By: Alcide Clever M.D.   On: 12/05/2017 17:03   Dg Tibia/fibula Left  Result Date: 12/05/2017 CLINICAL DATA:  Leg fracture EXAM: LEFT TIBIA AND FIBULA - 2 VIEW COMPARISON:  12/05/2016 FINDINGS: Six low resolution intraoperative spot views of the left tibia and fibula. Total fluoroscopy time was 3 minutes 43 seconds. The images demonstrate intramedullary rod and proximal and distal screw fixation of the tibia across comminuted mid tibial fracture with multiple displaced bone fragments along the  medial cortex of the mid tibia. Also noted is an acute mildly displaced fracture involving the midshaft of the fibula. IMPRESSION: Intra operative fluoroscopic assistance provided during surgical fixation of tibial fracture. Electronically Signed   By: Jasmine Pang M.D.   On: 12/05/2017 23:09   Dg Tibia/fibula Left  Result Date: 12/05/2017 CLINICAL DATA:  Left lower leg pain after losing control of the skateboard going down heel. EXAM: LEFT TIBIA AND FIBULA - 2 VIEW COMPARISON:  None in PACs FINDINGS: The patient has sustained complete fractures of the mid shafts of the left tibia and fibula. There angulation at both fracture sites apex anterior. There is comminution of the fracture fragments. There is displacement as well. IMPRESSION: There are acute comminuted angulated and displaced fractures of the midshaft of the left tibia and fibula. No definite soft tissue disruption is observed. The observed portions of the knee and ankle exhibit no acute abnormalities. Electronically Signed   By: David  [place] M.D.   On: 12/05/2017 15:48   Dg C-arm 1-60 Min-no Report  Result Date: 12/05/2017 Fluoroscopy was utilized by the requesting physician.  No radiographic interpretation.    Disposition: to home  Discharge Instructions    Discharge patient   Complete by:  As directed    Discharge disposition:  01-Home or Self Care   Discharge patient date:  12/07/2017      Follow-up Information    Kathryne Hitch, MD. Schedule an appointment as soon as possible for a visit in 2 week(s).   Specialty:  Orthopedic Surgery Contact information: [address] [place] [address] [phone]            Signed: Kathryne Hitch 12/07/2017, 8:23 AM

## 2017-12-07 NOTE — Progress Notes (Signed)
Discharge instructions and medications discussed with patient.  Prescriptions and AVS given to patient. All questions answered.  

## 2017-12-09 ENCOUNTER — Telehealth (INDEPENDENT_AMBULATORY_CARE_PROVIDER_SITE_OTHER): Payer: Self-pay | Admitting: Orthopaedic Surgery

## 2017-12-09 NOTE — Telephone Encounter (Signed)
Patient called having some questions about his surgical dressing, and also about a handicap placard? CB# [phone]

## 2017-12-10 ENCOUNTER — Encounter (HOSPITAL_COMMUNITY): Payer: Self-pay | Admitting: Orthopaedic Surgery

## 2017-12-10 NOTE — Telephone Encounter (Signed)
Patient aware he can take this off

## 2017-12-16 ENCOUNTER — Telehealth (INDEPENDENT_AMBULATORY_CARE_PROVIDER_SITE_OTHER): Payer: Self-pay | Admitting: Orthopaedic Surgery

## 2017-12-16 MED ORDER — OXYCODONE HCL 10 MG PO TABS: 5.0000 mg | ORAL_TABLET | Freq: Four times a day (QID) | ORAL | 0 refills | Status: DC | PRN

## 2017-12-16 NOTE — Telephone Encounter (Signed)
[facility]

## 2017-12-16 NOTE — Telephone Encounter (Signed)
Patient called needing Rx refilled (Oxycodone) 10mg    The number to contact patient is [phone]

## 2017-12-16 NOTE — Telephone Encounter (Signed)
I did not see this note until late so he can come Tuesday/tomorrow to pick this up.

## 2017-12-17 NOTE — Telephone Encounter (Signed)
Ok to feel I guess right?

## 2017-12-17 NOTE — Telephone Encounter (Signed)
I'm fine with refilling it given the severity of his broken leg and the surgery needed.  Tell him to use sparingly, because we can't keep refilling it like that

## 2017-12-17 NOTE — Telephone Encounter (Signed)
Patient aware.

## 2017-12-17 NOTE — Telephone Encounter (Signed)
Patient called and pharmacy told him they need approval from Dr. Magnus IvanBlackman to give him RX due to it being too soon to pick it up. Please advise.

## 2017-12-17 NOTE — Telephone Encounter (Signed)
They will fill tomorrow

## 2017-12-19 ENCOUNTER — Ambulatory Visit (INDEPENDENT_AMBULATORY_CARE_PROVIDER_SITE_OTHER): Payer: Self-pay | Admitting: Orthopaedic Surgery

## 2017-12-19 ENCOUNTER — Encounter (INDEPENDENT_AMBULATORY_CARE_PROVIDER_SITE_OTHER): Payer: Self-pay | Admitting: Orthopaedic Surgery

## 2017-12-19 ENCOUNTER — Ambulatory Visit (INDEPENDENT_AMBULATORY_CARE_PROVIDER_SITE_OTHER): Payer: Self-pay

## 2017-12-19 DIAGNOSIS — S82402D Unspecified fracture of shaft of left fibula, subsequent encounter for closed fracture with routine healing: Secondary | ICD-10-CM

## 2017-12-19 DIAGNOSIS — S82202D Unspecified fracture of shaft of left tibia, subsequent encounter for closed fracture with routine healing: Secondary | ICD-10-CM

## 2017-12-19 NOTE — Progress Notes (Signed)
The patient is 2 weeks today status post intramedullary nail placement in his left tibia to treat a tibia and fibula fracture of the midshaft.  This occurred after a skateboarding injury.  With crutches.  We did perform fasciotomies as well.  He says overall he is making progress.  We have had to refill his pain medications to me which were appropriate given the amount of trauma to his left lower extremity.  On exam all incisions look good to removed all the sutures and placed Steri-Strips.  Is got some limitations of motion of his foot and as well as his ankle and knee but overall he appears to be neurovascularly intact.  2 views of the left tibia and fibula show an intact intramedullary nail with near anatomic alignment of the fracture.  At this point we will continue to slowly increase his activities as comfort allows.  He can attempt full weightbearing as tolerated on that leg.  See him back in 4 weeks with a repeat AP and lateral of left tib-fib.

## 2017-12-25 ENCOUNTER — Other Ambulatory Visit (INDEPENDENT_AMBULATORY_CARE_PROVIDER_SITE_OTHER): Payer: Self-pay | Admitting: Orthopaedic Surgery

## 2017-12-25 ENCOUNTER — Telehealth (INDEPENDENT_AMBULATORY_CARE_PROVIDER_SITE_OTHER): Payer: Self-pay | Admitting: Orthopaedic Surgery

## 2017-12-25 MED ORDER — OXYCODONE HCL 10 MG PO TABS: 5.0000 mg | ORAL_TABLET | Freq: Four times a day (QID) | ORAL | 0 refills | Status: DC | PRN

## 2017-12-25 NOTE — Telephone Encounter (Signed)
Patient called asking for a refill on his oxycodone. CB # [phone]

## 2017-12-25 NOTE — Telephone Encounter (Signed)
Hopefully I escribed it into his pharmacy correctly.

## 2017-12-25 NOTE — Telephone Encounter (Signed)
Please advise 

## 2017-12-26 ENCOUNTER — Telehealth (INDEPENDENT_AMBULATORY_CARE_PROVIDER_SITE_OTHER): Payer: Self-pay | Admitting: Orthopaedic Surgery

## 2017-12-26 MED ORDER — OXYCODONE HCL 10 MG PO TABS: 5.0000 mg | ORAL_TABLET | Freq: Four times a day (QID) | ORAL | 0 refills | Status: DC | PRN

## 2017-12-26 NOTE — Telephone Encounter (Signed)
Patient called advised the Rx for Oxycodone will need to be approved before it can be filled. Patient asked for a call back when he can pick up Rx. The number to contact patient is [phone]

## 2017-12-26 NOTE — Telephone Encounter (Signed)
Patient called asking if he can have Rx oxycodone because he's going out of town and will be out before he gets back

## 2017-12-26 NOTE — Telephone Encounter (Signed)
They won't fill it because it's too early. So he would like it written so he can take it out of town when he runs out he won't be able to fill until the 18th

## 2017-12-26 NOTE — Telephone Encounter (Signed)
I sent some in yesterday using the narcotics system verification using my phone

## 2018-01-16 ENCOUNTER — Ambulatory Visit (INDEPENDENT_AMBULATORY_CARE_PROVIDER_SITE_OTHER): Payer: Self-pay

## 2018-01-16 ENCOUNTER — Ambulatory Visit (INDEPENDENT_AMBULATORY_CARE_PROVIDER_SITE_OTHER): Payer: Self-pay | Admitting: Orthopaedic Surgery

## 2018-01-16 ENCOUNTER — Encounter (INDEPENDENT_AMBULATORY_CARE_PROVIDER_SITE_OTHER): Payer: Self-pay | Admitting: Orthopaedic Surgery

## 2018-01-16 DIAGNOSIS — S82402D Unspecified fracture of shaft of left fibula, subsequent encounter for closed fracture with routine healing: Secondary | ICD-10-CM

## 2018-01-16 MED ORDER — OXYCODONE HCL 5 MG PO CAPS: 5.0000 mg | ORAL_CAPSULE | ORAL | 0 refills | Status: AC | PRN

## 2018-01-16 NOTE — Progress Notes (Signed)
The patient is now 6 weeks status post a traumatic left tibia and fibula fracture.  He had 4 compartment fasciotomies performed due to impending compartment syndrome and we were able to stabilize the fracture with an intramedullary nail through the tibia.  He still ambulate with crutches having a hard time with weightbearing and pain.  On examination his knee and fasciotomy incisions have all healed nicely.  There is still a significant amount of swelling in his leg comparing his left operative leg to the right leg.  He can flex and extend his knee and he has better function of his left foot which was certainly weak with dorsiflexion before.  2 views of his left tibia and fibula show some slight interval healing of the fracture itself but the fracture lines are still visible.  The hardware appears stable.  He will continue attempts to weight-bear as tolerated and I did refill his oxycodone at least one more time.  We will try to wean him to hydrocodone  next.  We will see him back in 4 weeks with a repeat AP and lateral of the left tibia and fibula.

## 2018-02-13 ENCOUNTER — Ambulatory Visit (INDEPENDENT_AMBULATORY_CARE_PROVIDER_SITE_OTHER): Payer: Self-pay | Admitting: Orthopaedic Surgery

## 2019-10-30 IMAGING — RF DG TIBIA/FIBULA 2V*L*
1 series · 6 of 6 positions shown · non-contrast
Comparison: 12/05/2016

CLINICAL DATA: Leg fracture

EXAM:
LEFT TIBIA AND FIBULA - 2 VIEW

[Series 1: run · 6 of 6 slices shown]
[im 1/6]
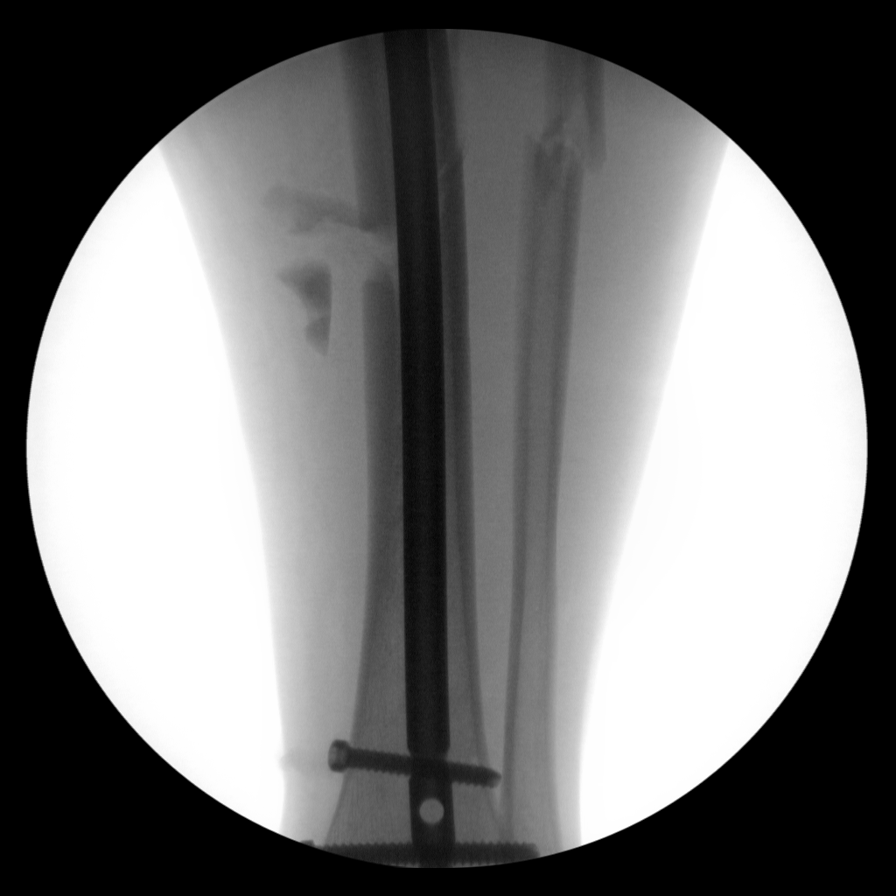
[im 2/6]
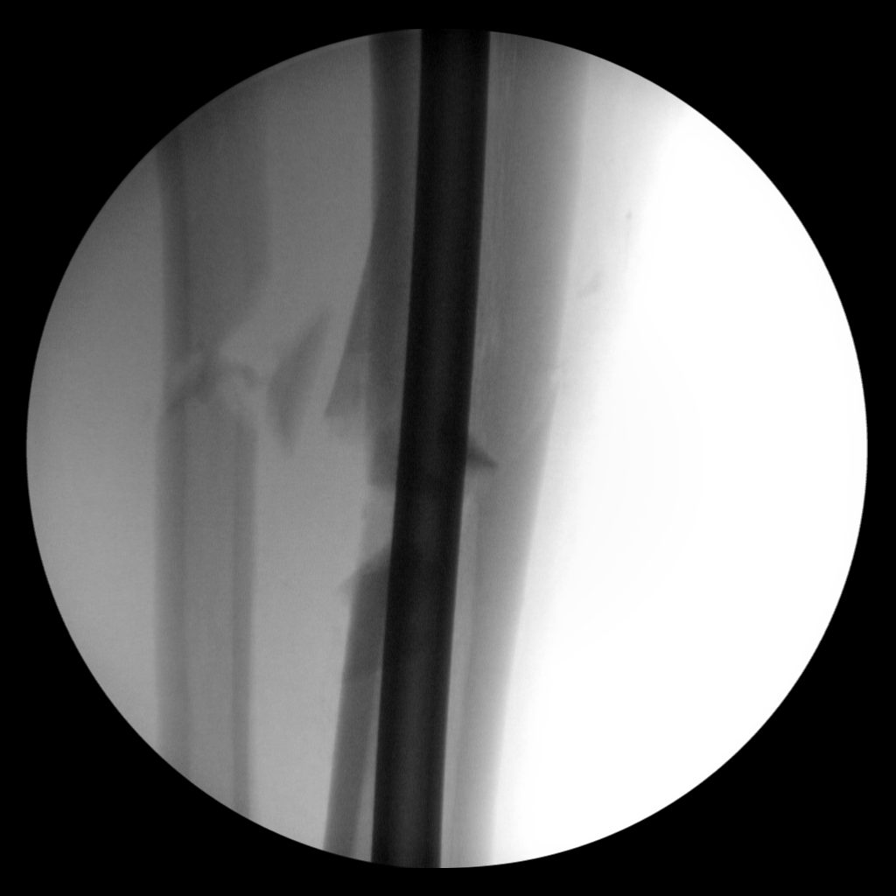
[im 3/6]
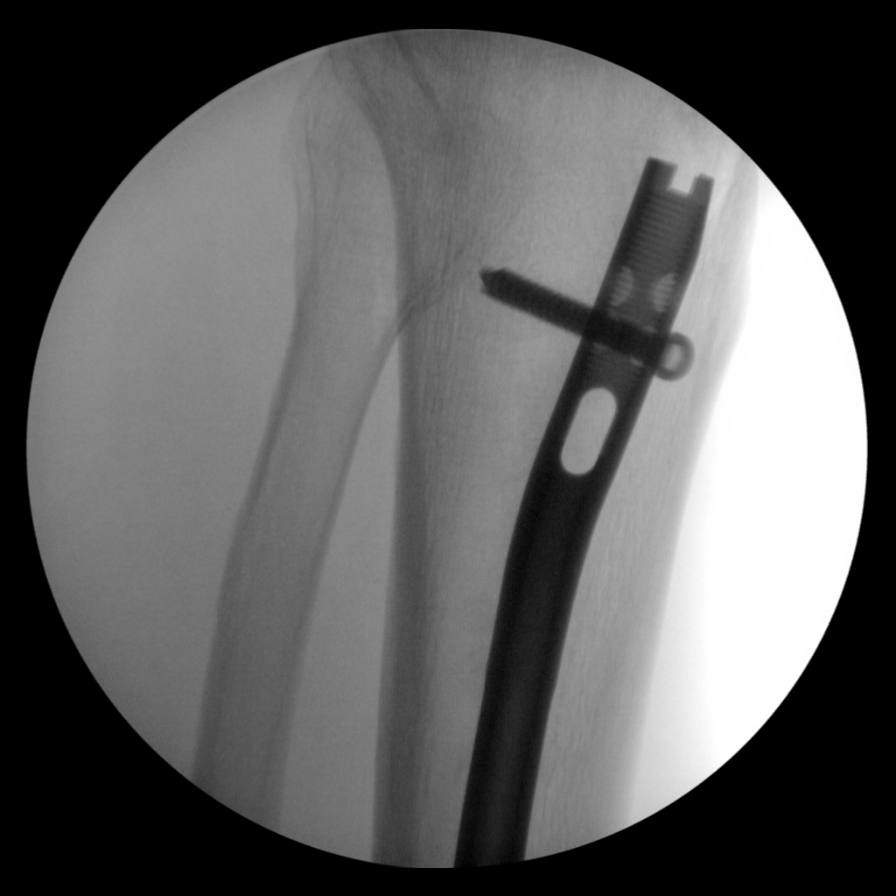
[im 4/6]
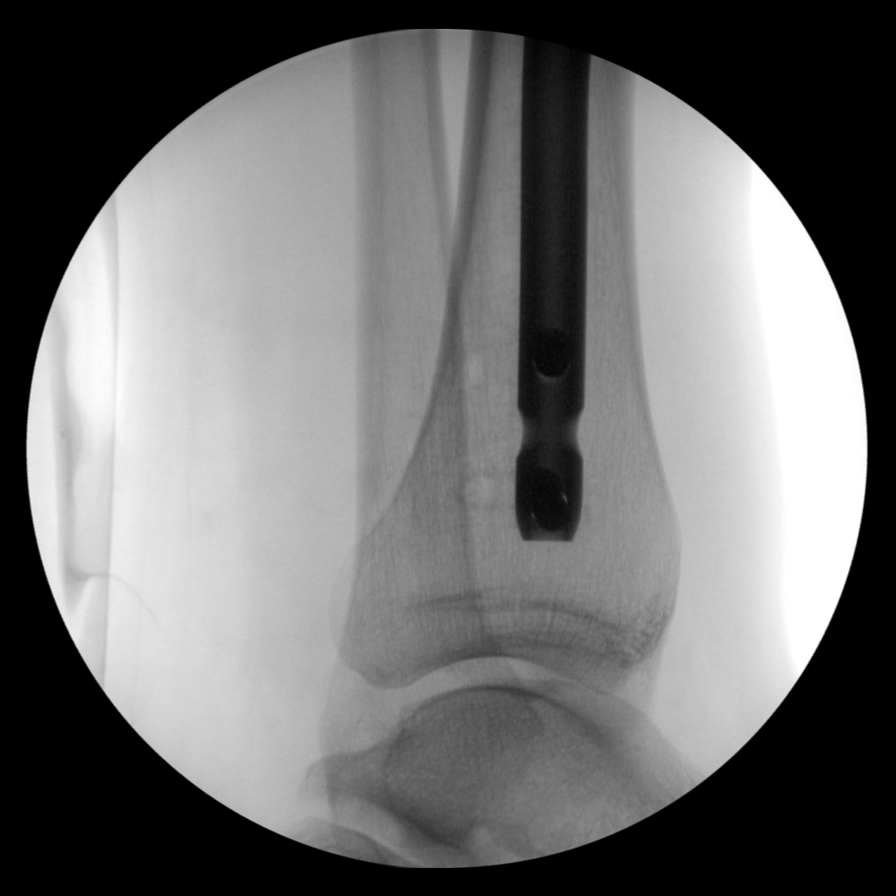
[im 5/6]
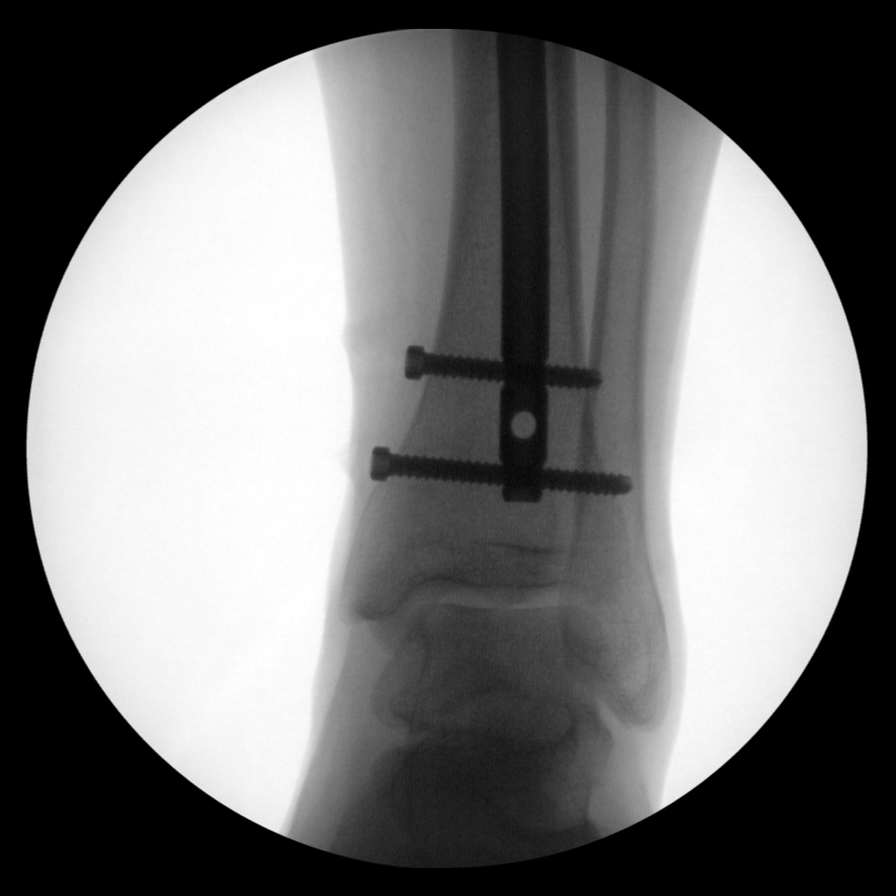
[im 6/6]
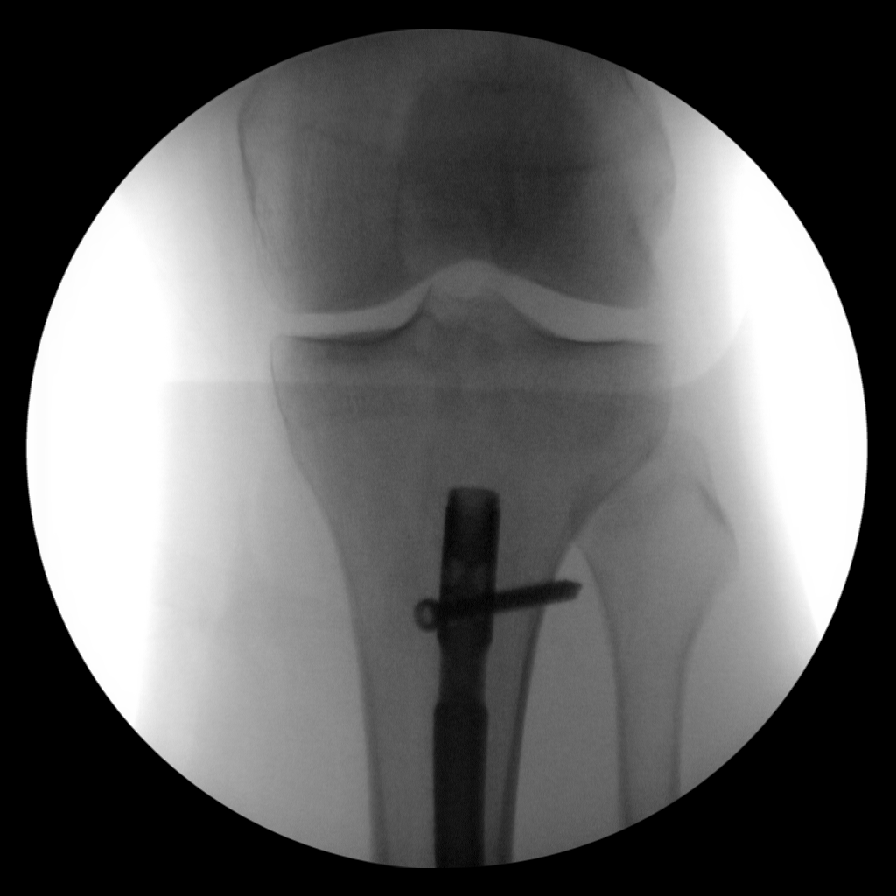

[6 of 6 positions shown; findings below may reference images not displayed]

FINDINGS: Six low resolution intraoperative spot views of the left tibia and
fibula. Total fluoroscopy time was 3 minutes 43 seconds. The images
demonstrate intramedullary rod and proximal and distal screw
fixation of the tibia across comminuted mid tibial fracture with
multiple displaced bone fragments along the medial cortex of the mid
tibia. Also noted is an acute mildly displaced fracture involving
the midshaft of the fibula.
IMPRESSION: Intra operative fluoroscopic assistance provided during surgical
fixation of tibial fracture.
